# Patient Record
Sex: Male | Born: 2005 | Race: White | Hispanic: No | Marital: Single | Smoking: Never smoker
Health system: Southern US, Community
[De-identification: ages and names within clinical notes are randomized; demographics above are authoritative.]

## PROBLEM LIST (undated history)

## (undated) DIAGNOSIS — J45909 Unspecified asthma, uncomplicated: Secondary | ICD-10-CM

## (undated) HISTORY — DX: Unspecified asthma, uncomplicated: J45.909

---
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
## 2023-04-28 ENCOUNTER — Emergency Department (HOSPITAL_COMMUNITY)
Admission: EM | Admit: 2023-04-28 | Discharge: 2023-04-28 | Disposition: A | Discharge status: Home / Self Care | Payer: Medicaid Other | Attending: Emergency Medicine | Admitting: Emergency Medicine

## 2023-04-28 ENCOUNTER — Encounter (HOSPITAL_COMMUNITY): Payer: Self-pay | Admitting: Emergency Medicine

## 2023-04-28 ENCOUNTER — Emergency Department (HOSPITAL_COMMUNITY): Payer: Medicaid Other

## 2023-04-28 ENCOUNTER — Other Ambulatory Visit: Payer: Self-pay

## 2023-04-28 DIAGNOSIS — W312XXA Contact with powered woodworking and forming machines, initial encounter: Secondary | ICD-10-CM | POA: Insufficient documentation

## 2023-04-28 DIAGNOSIS — Z23 Encounter for immunization: Secondary | ICD-10-CM | POA: Insufficient documentation

## 2023-04-28 DIAGNOSIS — S1091XA Abrasion of unspecified part of neck, initial encounter: Secondary | ICD-10-CM | POA: Diagnosis not present

## 2023-04-28 DIAGNOSIS — S0181XA Laceration without foreign body of other part of head, initial encounter: Secondary | ICD-10-CM | POA: Diagnosis not present

## 2023-04-28 DIAGNOSIS — S01511A Laceration without foreign body of lip, initial encounter: Secondary | ICD-10-CM | POA: Insufficient documentation

## 2023-04-28 DIAGNOSIS — J45909 Unspecified asthma, uncomplicated: Secondary | ICD-10-CM | POA: Diagnosis not present

## 2023-04-28 DIAGNOSIS — S0990XA Unspecified injury of head, initial encounter: Secondary | ICD-10-CM | POA: Diagnosis present

## 2023-04-28 DIAGNOSIS — S01512A Laceration without foreign body of oral cavity, initial encounter: Secondary | ICD-10-CM

## 2023-04-28 LAB — BASIC METABOLIC PANEL
Anion gap: 10 (ref 5–15)
BUN: 10 mg/dL (ref 6–20)
CO2: 26 mmol/L (ref 22–32)
Calcium: 9.2 mg/dL (ref 8.9–10.3)
Chloride: 103 mmol/L (ref 98–111)
Creatinine, Ser: 0.99 mg/dL (ref 0.61–1.24)
GFR, Estimated: >60 mL/min (ref >60)
Glucose, Bld: 126 mg/dL — ABNORMAL HIGH (ref 70–99)
Potassium: 3.4 mmol/L — ABNORMAL LOW (ref 3.5–5.1)
Sodium: 139 mmol/L (ref 135–145)

## 2023-04-28 LAB — CBC
HCT: 42.8 % (ref 39.0–52.0)
Hemoglobin: 14.6 g/dL (ref 13.0–17.0)
MCH: 31.6 pg (ref 26.0–34.0)
MCHC: 34.1 g/dL (ref 30.0–36.0)
MCV: 92.6 fL (ref 80.0–100.0)
Platelets: 205 10*3/uL (ref 150–400)
RBC: 4.62 MIL/uL (ref 4.22–5.81)
RDW: 11.9 % (ref 11.5–15.5)
WBC: 7.7 10*3/uL (ref 4.0–10.5)
nRBC: 0 % (ref 0.0–0.2)

## 2023-04-28 LAB — I-STAT CHEM 8, ED
BUN: 13 mg/dL (ref 6–20)
Calcium, Ion: 1.02 mmol/L — ABNORMAL LOW (ref 1.15–1.40)
Chloride: 102 mmol/L (ref 98–111)
Creatinine, Ser: 1 mg/dL (ref 0.61–1.24)
Glucose, Bld: 130 mg/dL — ABNORMAL HIGH (ref 70–99)
HCT: 44 % (ref 39.0–52.0)
Hemoglobin: 15 g/dL (ref 13.0–17.0)
Potassium: 3.8 mmol/L (ref 3.5–5.1)
Sodium: 139 mmol/L (ref 135–145)
TCO2: 26 mmol/L (ref 22–32)

## 2023-04-28 MED ORDER — LIDOCAINE-EPINEPHRINE-TETRACAINE (LET) TOPICAL GEL
3.0000 mL | Freq: Once | TOPICAL | Status: AC
  Administered 2023-04-28: 3 mL via TOPICAL
  Filled 2023-04-28: qty 3

## 2023-04-28 MED ORDER — TETANUS-DIPHTH-ACELL PERTUSSIS 5-2.5-18.5 LF-MCG/0.5 IM SUSY
0.5000 mL | PREFILLED_SYRINGE | Freq: Once | INTRAMUSCULAR | Status: AC
  Administered 2023-04-28: 0.5 mL via INTRAMUSCULAR
  Filled 2023-04-28: qty 0.5

## 2023-04-28 MED ORDER — IOHEXOL 350 MG/ML SOLN
75.0000 mL | Freq: Once | INTRAVENOUS | Status: AC | PRN
  Administered 2023-04-28: 75 mL via INTRAVENOUS

## 2023-04-28 MED ORDER — ONDANSETRON HCL 4 MG/2ML IJ SOLN: 4.0000 mg | Freq: Once | INTRAMUSCULAR | Status: DC

## 2023-04-28 MED ORDER — AMOXICILLIN-POT CLAVULANATE 875-125 MG PO TABS: 1.0000 | ORAL_TABLET | Freq: Two times a day (BID) | ORAL | 0 refills | Status: AC

## 2023-04-28 MED ORDER — KETOROLAC TROMETHAMINE 30 MG/ML IJ SOLN
30.0000 mg | Freq: Once | INTRAMUSCULAR | Status: AC
  Administered 2023-04-28: 30 mg via INTRAVENOUS
  Filled 2023-04-28: qty 1

## 2023-04-28 MED ORDER — MORPHINE SULFATE (PF) 2 MG/ML IV SOLN: 2.0000 mg | Freq: Once | INTRAVENOUS | Status: DC

## 2023-04-28 MED ORDER — LIDOCAINE-EPINEPHRINE (PF) 2 %-1:200000 IJ SOLN
10.0000 mL | Freq: Once | INTRAMUSCULAR | Status: AC
  Administered 2023-04-28: 10 mL
  Filled 2023-04-28: qty 20

## 2023-04-28 NOTE — ED Notes (Signed)
Patient transported to CT 

## 2023-04-28 NOTE — ED Provider Notes (Signed)
[place] EMERGENCY DEPARTMENT AT [facility] Provider Note   CSN: [id number] Arrival date & time: 04/28/23  1424     History  Chief Complaint  Patient presents with   Laceration    Casey Benjamin is a 17 y.o. male with history of asthma who presents the emergency department with a facial laceration.  Patient was using a wire grinding saw, when it kicked back and hit him in the face.  Sustained a laceration to his left lower chin, and abrasions on his neck. Does not believe he lost consciousness.   Laceration      Home Medications Prior to Admission medications   Medication Sig Start Date End Date Taking? Authorizing Provider  amoxicillin-clavulanate (AUGMENTIN) 875-125 MG tablet Take 1 tablet by mouth every 12 (twelve) hours. 04/28/23  Yes Pranav Lince T, PA-C      Allergies    Patient has no known allergies.    Review of Systems   Review of Systems  Skin:  Positive for wound.  All other systems reviewed and are negative.   Physical Exam Updated Vital Signs BP 128/74   Pulse 93   Temp 98.1 F (36.7 C) (Axillary)   Resp 17   Ht 5\' 10"  (1.778 m)   Wt 63.5 kg   SpO2 100%   BMI 20.09 kg/m  Physical Exam Vitals and nursing note reviewed.  Constitutional:      Appearance: Normal appearance.  HENT:     Head: Normocephalic.      Comments: Through and through laceration approximately 4.5 cm in length to exterior portion    Mouth/Throat:      Comments: Interior laceration component gaping, about 1.5 cm. Small gum laceration, no dental injury noted Eyes:     Conjunctiva/sclera: Conjunctivae normal.  Pulmonary:     Effort: Pulmonary effort is normal. No respiratory distress.  Skin:    General: Skin is warm and dry.  Neurological:     Mental Status: He is alert.  Psychiatric:        Mood and Affect: Mood normal.        Behavior: Behavior normal.          ED Results / Procedures / Treatments   Labs (all labs ordered are listed,  but only abnormal results are displayed) Labs Reviewed  BASIC METABOLIC PANEL - Abnormal; Notable for the following components:      Result Value   Potassium 3.4 (*)    Glucose, Bld 126 (*)    All other components within normal limits  I-STAT CHEM 8, ED - Abnormal; Notable for the following components:   Glucose, Bld 130 (*)    Calcium, Ion 1.02 (*)    All other components within normal limits  CBC    EKG None  Radiology No results found.  Procedures .Marland KitchenLaceration Repair  Date/Time: 04/28/2023 7:36 PM  Performed by: Su Monks, PA-C Authorized by: Su Monks, PA-C   Consent:    Consent obtained:  Verbal   Consent given by:  Patient   Risks, benefits, and alternatives were discussed: yes     Risks discussed:  Poor cosmetic result, poor wound healing, infection, need for additional repair and nerve damage   Alternatives discussed:  No treatment Universal protocol:    Procedure explained and questions answered to patient or proxy's satisfaction: yes     Patient identity confirmed:  Provided demographic data Anesthesia:    Anesthesia method:  Nerve block   Block location:  Mental, L   Block needle gauge:  25 G   Block anesthetic:  Lidocaine 2% WITH epi   Block injection procedure:  Anatomic landmarks identified   Block outcome:  Anesthesia achieved Laceration details:    Location:  Face   Face location:  Chin   Length (cm):  4.5   Depth (mm):  2 Pre-procedure details:    Preparation:  Patient was prepped and draped in usual sterile fashion and imaging obtained to evaluate for foreign bodies Exploration:    Limited defect created (wound extended): no     Hemostasis achieved with:  Epinephrine, direct pressure and LET   Imaging obtained comment:  CT   Imaging outcome: foreign body not noted   Treatment:    Area cleansed with:  Saline   Amount of cleaning:  Standard   Irrigation solution:  Sterile saline   Irrigation volume:  250 cc   Irrigation  method:  Pressure wash   Visualized foreign bodies/material removed: no     Debridement:  None   Undermining:  None   Scar revision: no   Skin repair:    Repair method:  Sutures   Suture size:  5-0   Suture material:  Prolene   Suture technique:  Simple interrupted   Number of sutures:  6 Approximation:    Approximation:  Close Repair type:    Repair type:  Intermediate Post-procedure details:    Dressing:  Non-adherent dressing   Procedure completion:  Tolerated well, no immediate complications .Marland KitchenLaceration Repair  Date/Time: 04/28/2023 7:39 PM  Performed by: Su Monks, PA-C Authorized by: Su Monks, PA-C   Consent:    Consent obtained:  Verbal   Consent given by:  Patient   Risks, benefits, and alternatives were discussed: yes     Risks discussed:  Infection, pain, poor cosmetic result, poor wound healing and nerve damage Universal protocol:    Procedure explained and questions answered to patient or proxy's satisfaction: yes     Patient identity confirmed:  Provided demographic data Anesthesia:    Anesthesia method:  Nerve block Laceration details:    Location:  Mouth   Mouth location: gum, interior lip.   Length (cm):  2 Exploration:    Hemostasis achieved with:  Epinephrine Treatment:    Area cleansed with:  Saline   Amount of cleaning:  Standard   Irrigation solution:  Sterile saline Skin repair:    Repair method:  Sutures   Suture size:  6-0   Suture material:  Chromic gut   Suture technique:  Simple interrupted   Number of sutures:  2 Approximation:    Approximation:  Close Repair type:    Repair type:  Intermediate Post-procedure details:    Dressing:  Open (no dressing)   Procedure completion:  Tolerated well, no immediate complications .Nerve Block  Date/Time: 04/28/2023 7:41 PM  Performed by: Su Monks, PA-C Authorized by: Su Monks, PA-C   Consent:    Consent obtained:  Verbal   Consent given by:   Patient   Risks, benefits, and alternatives were discussed: yes     Risks discussed:  Unsuccessful block, swelling and pain Universal protocol:    Procedure explained and questions answered to patient or proxy's satisfaction: yes     Patient identity confirmed:  Provided demographic data Indications:    Indications:  Procedural anesthesia Location:    Body area:  Head   Head nerve blocked: mental.   Laterality:  Left Skin anesthesia:  Skin anesthesia method:  None Procedure details:    Block needle gauge:  25 G   Anesthetic injected:  Lidocaine 2% WITH epi   Steroid injected:  None   Additive injected:  None   Injection procedure:  Anatomic landmarks identified   Paresthesia:  None Post-procedure details:    Outcome:  Anesthesia achieved   Procedure completion:  Tolerated well, no immediate complications     Medications Ordered in ED Medications  ondansetron (ZOFRAN) injection 4 mg (0 mg Intravenous Hold 04/28/23 1722)  Tdap (BOOSTRIX) injection 0.5 mL (0.5 mLs Intramuscular Given 04/28/23 1541)  lidocaine-EPINEPHrine (XYLOCAINE W/EPI) 2 %-1:200000 (PF) injection 10 mL (10 mLs Infiltration Given 04/28/23 1552)  iohexol (OMNIPAQUE) 350 MG/ML injection 75 mL (75 mLs Intravenous Contrast Given 04/28/23 1714)  lidocaine-EPINEPHrine-tetracaine (LET) topical gel (3 mLs Topical Given 04/28/23 1802)  ketorolac (TORADOL) 30 MG/ML injection 30 mg (30 mg Intravenous Given 04/28/23 1828)    ED Course/ Medical Decision Making/ A&P                                 Medical Decision Making Risk Prescription drug management.   Patient is a 17 y.o. male who presents to the emergency department with concern for facial laceration. Wound occurred <1 hr prior to ER arrival.   Physical exam: Through and through laceration to the left lower chin/jaw as imaged above, interior component slightly gaping.   Imaging: CT soft tissue with soft tissue laceration, no fractures or fluid  collections.   Medications: Given toradol for pain  Procedure: Wound explored and base of wound visualized in a bloodless field without evidence of foreign body. Laceration was anesthestized with LET and lidocaine and closed with sutures. Patient tolerated procedure well with no immediate complications. Tdap was updated.   Disposition: Patient has no comorbidities to effect normal wound healing, however with intraoral component, will give prescription for augmentin. Discussed suture home care with patient and answered questions. Patient to follow-up for wound check and suture removal in 5 days; they are to return to the ED sooner for signs of infection. Pt is hemodynamically stable with no complaints prior to discharge.  Final Clinical Impression(s) / ED Diagnoses Final diagnoses:  Facial laceration, initial encounter  Laceration of oral cavity without foreign body, initial encounter  Abrasion of neck, initial encounter    Rx / DC Orders ED Discharge Orders          Ordered    amoxicillin-clavulanate (AUGMENTIN) 875-125 MG tablet  Every 12 hours        04/28/23 1925           Portions of this report may have been transcribed using voice recognition software. Every effort was made to ensure accuracy; however, inadvertent computerized transcription errors may be present.    Jeanella Flattery 04/28/23 1943    Glyn Ade, MD 04/29/23 1507

## 2023-04-28 NOTE — Progress Notes (Signed)
   04/28/23 1450  Spiritual Encounters  Type of Visit Initial  Care provided to: Pt and family  Conversation partners present during encounter Nurse  Reason for visit Routine spiritual support  OnCall Visit No   Patient family arrived after receiving phone call from triage. Escorted family to bedside.

## 2023-04-28 NOTE — ED Triage Notes (Signed)
Pt reports he was cut in the face with "wire grinding saw." PT has full thickness laceration to the left lower jaw and into the lower gum. Bleeding controlled.

## 2023-04-28 NOTE — ED Provider Triage Note (Signed)
Emergency Medicine Provider Triage Evaluation Note  Casey Benjamin , a 17 y.o. male  was evaluated in triage.  Pt complains of facial laceration.  The patient was using an angle saw when the saw kicked back and hit him in the face and neck.  Patient states he thinks his jaw might be broken.  He is unsure of his last tetanus vaccination.  The saw caused a laceration to the left side of his cheek and chin.  It is through and through and he can feel air bubbling from his mouth and out of the wound..  Review of Systems  Positive: laceration Negative: loc  Physical Exam  BP (!) 140/84 (BP Location: Right Arm)   Pulse 83   Temp 98.4 F (36.9 C)   Resp 18   SpO2 100%  Gen:   Awake, no distress   Resp:  Normal effort  MSK:   Moves extremities without difficulty  Other:     Medical Decision Making  Medically screening exam initiated at 3:10 PM.  Appropriate orders placed.  JAQUAVEON VANOS was informed that the remainder of the evaluation will be completed by another provider, this initial triage assessment does not replace that evaluation, and the importance of remaining in the ED until their evaluation is complete.     Arthor Captain, PA-C 04/28/23 1512

## 2023-04-28 NOTE — Discharge Instructions (Addendum)
You were seen in the emergency department for facial/mouth laceration.   We have closed your laceration(s) with sutures. These need to be removed in 5 days. This can be done at any doctor's office, urgent care, or emergency department.   If any of the sutures come out before it is time for removal, that is okay. Make sure to keep the area as clean and dry as possible. You can let warm soapy warm run over the area, but do NOT scrub it.   The sutures on the inside of your mouth will dissolve on their own. I've attached some instructions for how to care for these. I would recommend eating soft foods for the next week or so.   You can use bacitracin ointment on the neck abrasions to keep these protected and from drying out.   Watch out for signs of infection, like we discussed, including: increased redness, tenderness, or drainage of pus from the area. If this happens and you have not been prescribed an antibiotic, please seek medical attention for possible infection.   You can take over the counter pain medicine like ibuprofen or tylenol as needed.
